# Patient Record
Sex: Male | Born: 1985 | Race: Black or African American | Hispanic: No | Marital: Married
Health system: Southern US, Community
[De-identification: ages and names within clinical notes are randomized; demographics above are authoritative.]

---

## 2016-05-19 ENCOUNTER — Ambulatory Visit
Admission: RE | Admit: 2016-05-19 | Discharge: 2016-05-19 | Disposition: A | Discharge status: Home / Self Care | Payer: No Typology Code available for payment source | Source: Ambulatory Visit | Attending: Infectious Disease | Admitting: Infectious Disease

## 2016-05-19 ENCOUNTER — Other Ambulatory Visit: Payer: Self-pay | Admitting: Infectious Disease

## 2016-05-19 DIAGNOSIS — R7611 Nonspecific reaction to tuberculin skin test without active tuberculosis: Secondary | ICD-10-CM

## 2018-08-11 ENCOUNTER — Encounter (HOSPITAL_COMMUNITY): Payer: Self-pay | Admitting: Emergency Medicine

## 2018-08-11 ENCOUNTER — Emergency Department (HOSPITAL_COMMUNITY)
Admission: EM | Admit: 2018-08-11 | Discharge: 2018-08-12 | Disposition: A | Discharge status: Home / Self Care | Payer: BLUE CROSS/BLUE SHIELD | Attending: Emergency Medicine | Admitting: Emergency Medicine

## 2018-08-11 ENCOUNTER — Emergency Department (HOSPITAL_COMMUNITY): Payer: BLUE CROSS/BLUE SHIELD

## 2018-08-11 DIAGNOSIS — R42 Dizziness and giddiness: Secondary | ICD-10-CM | POA: Insufficient documentation

## 2018-08-11 DIAGNOSIS — Z79899 Other long term (current) drug therapy: Secondary | ICD-10-CM | POA: Insufficient documentation

## 2018-08-11 MED ORDER — MECLIZINE HCL 25 MG PO TABS
25.0000 mg | ORAL_TABLET | Freq: Once | ORAL | Status: AC
  Administered 2018-08-11: 25 mg via ORAL
  Filled 2018-08-11: qty 1

## 2018-08-11 NOTE — ED Triage Notes (Signed)
Patient c/o dizziness x3 increasing with movement and decreasing when sitting. Denies N/V/D. Reports intermittent headache. Denies blurred vision and weakness. Ambulatory.

## 2018-08-12 ENCOUNTER — Emergency Department (HOSPITAL_COMMUNITY): Payer: BLUE CROSS/BLUE SHIELD

## 2018-08-12 MED ORDER — MECLIZINE HCL 25 MG PO TABS: 25.0000 mg | ORAL_TABLET | Freq: Three times a day (TID) | ORAL | 0 refills | Status: AC | PRN

## 2018-08-12 NOTE — ED Provider Notes (Signed)
[facility]-EMERGENCY DEPT Provider Note   CSN: [id number] Arrival date & time: 08/11/18  1826     History   Chief Complaint Chief Complaint  Patient presents with  . Dizziness    HPI Todd Potts is a 33 y.o. male.   Dizziness  Quality:  Head spinning Severity:  Moderate Onset quality:  Gradual Duration:  3 days Timing:  Constant Chronicity:  New Relieved by:  None tried Worsened by:  Nothing Ineffective treatments:  None tried   History reviewed. No pertinent past medical history.  There are no active problems to display for this patient.   History reviewed. No pertinent surgical history.      Home Medications    Prior to Admission medications   Medication Sig Start Date End Date Taking? Authorizing Provider  IRON PO Take 1 tablet by mouth once.   Yes [provider]  meclizine (ANTIVERT) 25 MG tablet Take 1 tablet (25 mg total) by mouth 3 (three) times daily as needed for dizziness. 08/12/18   Keerat Denicola, Barbara Cower, MD  Vitamin D, Ergocalciferol, (DRISDOL) 1.25 MG (50000 UT) CAPS capsule Take 50,000 Units by mouth once a week. 07/13/18   [provider]    Family History No family history on file.  Social History Social History   Tobacco Use  . Smoking status: Not on file  Substance Use Topics  . Alcohol use: Not on file  . Drug use: Not on file     Allergies   Patient has no known allergies.   Review of Systems Review of Systems  Neurological: Positive for dizziness.  All other systems reviewed and are negative.    Physical Exam Updated Vital Signs BP 126/76   Pulse 71   Temp 98.9 F (37.2 C) (Oral)   Resp 15   SpO2 100%   Physical Exam Vitals signs and nursing note reviewed.  Constitutional:      Appearance: He is well-developed.  HENT:     Head: Normocephalic and atraumatic.     Right Ear: Tympanic membrane normal.     Left Ear: Tympanic membrane normal.  Neck:     Musculoskeletal: Normal  range of motion.  Cardiovascular:     Rate and Rhythm: Normal rate.     Comments: Left lateral chest ttp. No obvious ecchymosis, trauma. Pulmonary:     Effort: Pulmonary effort is normal. No respiratory distress.  Abdominal:     General: There is no distension.  Musculoskeletal: Normal range of motion.  Skin:    General: Skin is warm and dry.  Neurological:     General: No focal deficit present.     Mental Status: He is alert and oriented to person, place, and time.     Cranial Nerves: No cranial nerve deficit.     Sensory: No sensory deficit.      ED Treatments / Results  Labs (all labs ordered are listed, but only abnormal results are displayed) Labs Reviewed - No data to display  EKG EKG Interpretation  Date/Time:  Wednesday August 11 2018 23:40:10 EST Ventricular Rate:  63 PR Interval:    QRS Duration: 88 QT Interval:  383 QTC Calculation: 392 R Axis:   53 Text Interpretation:  Unknown rhythm, irregular rate Probable left atrial enlargement ST elev, probable normal early repol pattern No old tracing to compare Confirmed by Marily Memos [phone]) on 08/12/2018 12:35:03 AM   Radiology Dg Chest 2 View  Result Date: 08/12/2018 CLINICAL DATA:  Left chest wall  pain x1 week EXAM: CHEST - 2 VIEW COMPARISON:  05/19/2016 FINDINGS: Lungs are clear.  No pleural effusion or pneumothorax. The heart is normal in size. Visualized osseous structures are within normal limits. IMPRESSION: Normal chest radiographs. Electronically Signed   By: Charline Bills M.D.   On: 08/12/2018 00:21    Procedures Procedures (including critical care time)  Medications Ordered in ED Medications  meclizine (ANTIVERT) tablet 25 mg (25 mg Oral Given 08/11/18 2331)     Initial Impression / Assessment and Plan / ED Course  I have reviewed the triage vital signs and the nursing notes.  Pertinent labs & imaging results that were available during my care of the patient were reviewed by me and  considered in my medical decision making (see chart for details).     Vertigo - unclear etiology. No red flags for central causes. No recent illnesses to suggest neuritis. Will treat for bppv with pcp follow up.   Chest pain - ecg and cxr ok. Reproducible. Likely MSK. Doubt PE/ACS or other acute causes. pcp follow up.   Final Clinical Impressions(s) / ED Diagnoses   Final diagnoses:  Vertigo    ED Discharge Orders         Ordered    meclizine (ANTIVERT) 25 MG tablet  3 times daily PRN     08/12/18 0127           Alahni Varone, Barbara Cower, MD 08/12/18 0131

## 2019-12-30 IMAGING — CR DG CHEST 2V
2 series · 2 of 2 positions shown · non-contrast
Comparison: 05/19/2016

CLINICAL DATA: Left chest wall pain x1 week

EXAM:
CHEST - 2 VIEW

[w chest pa]
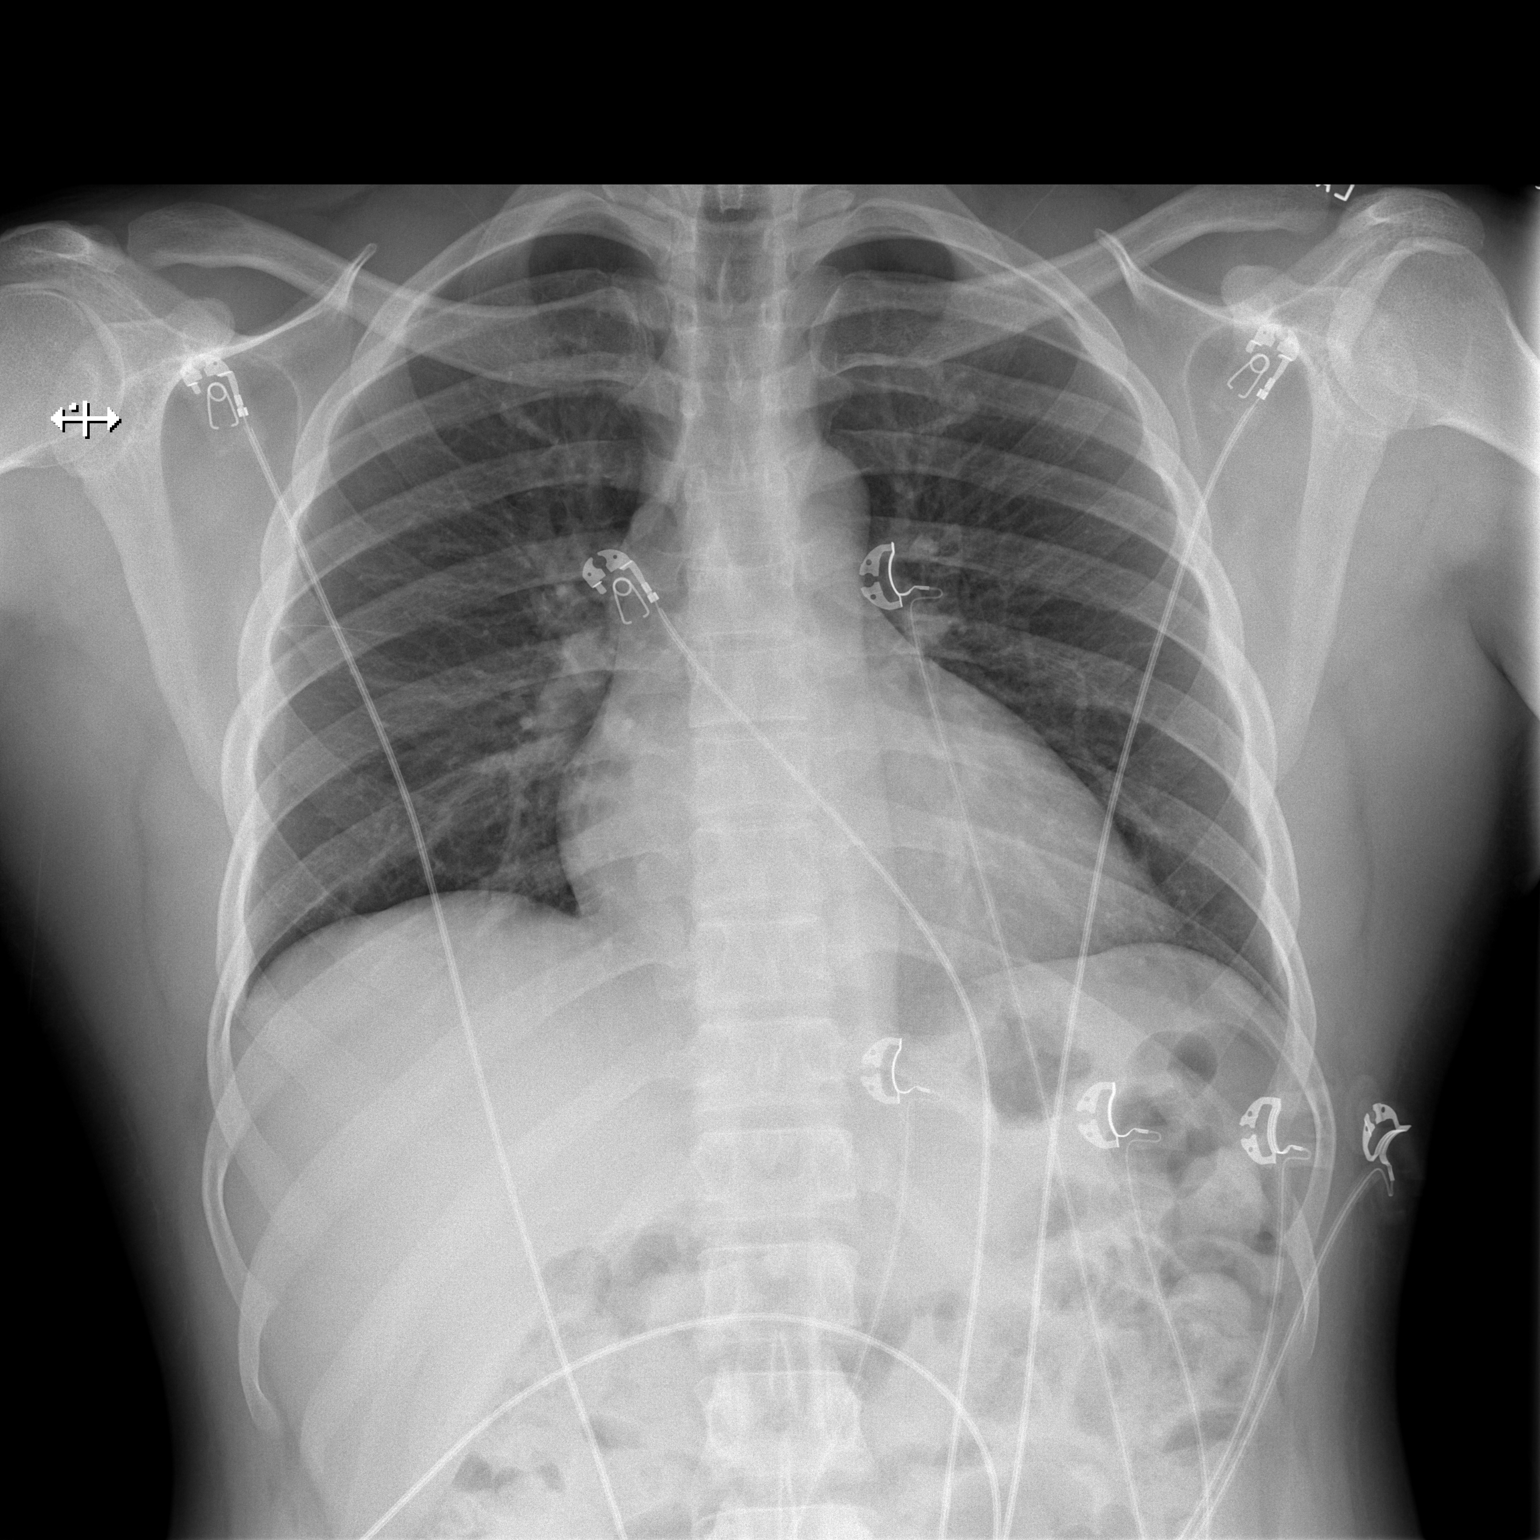

[w chest lat]
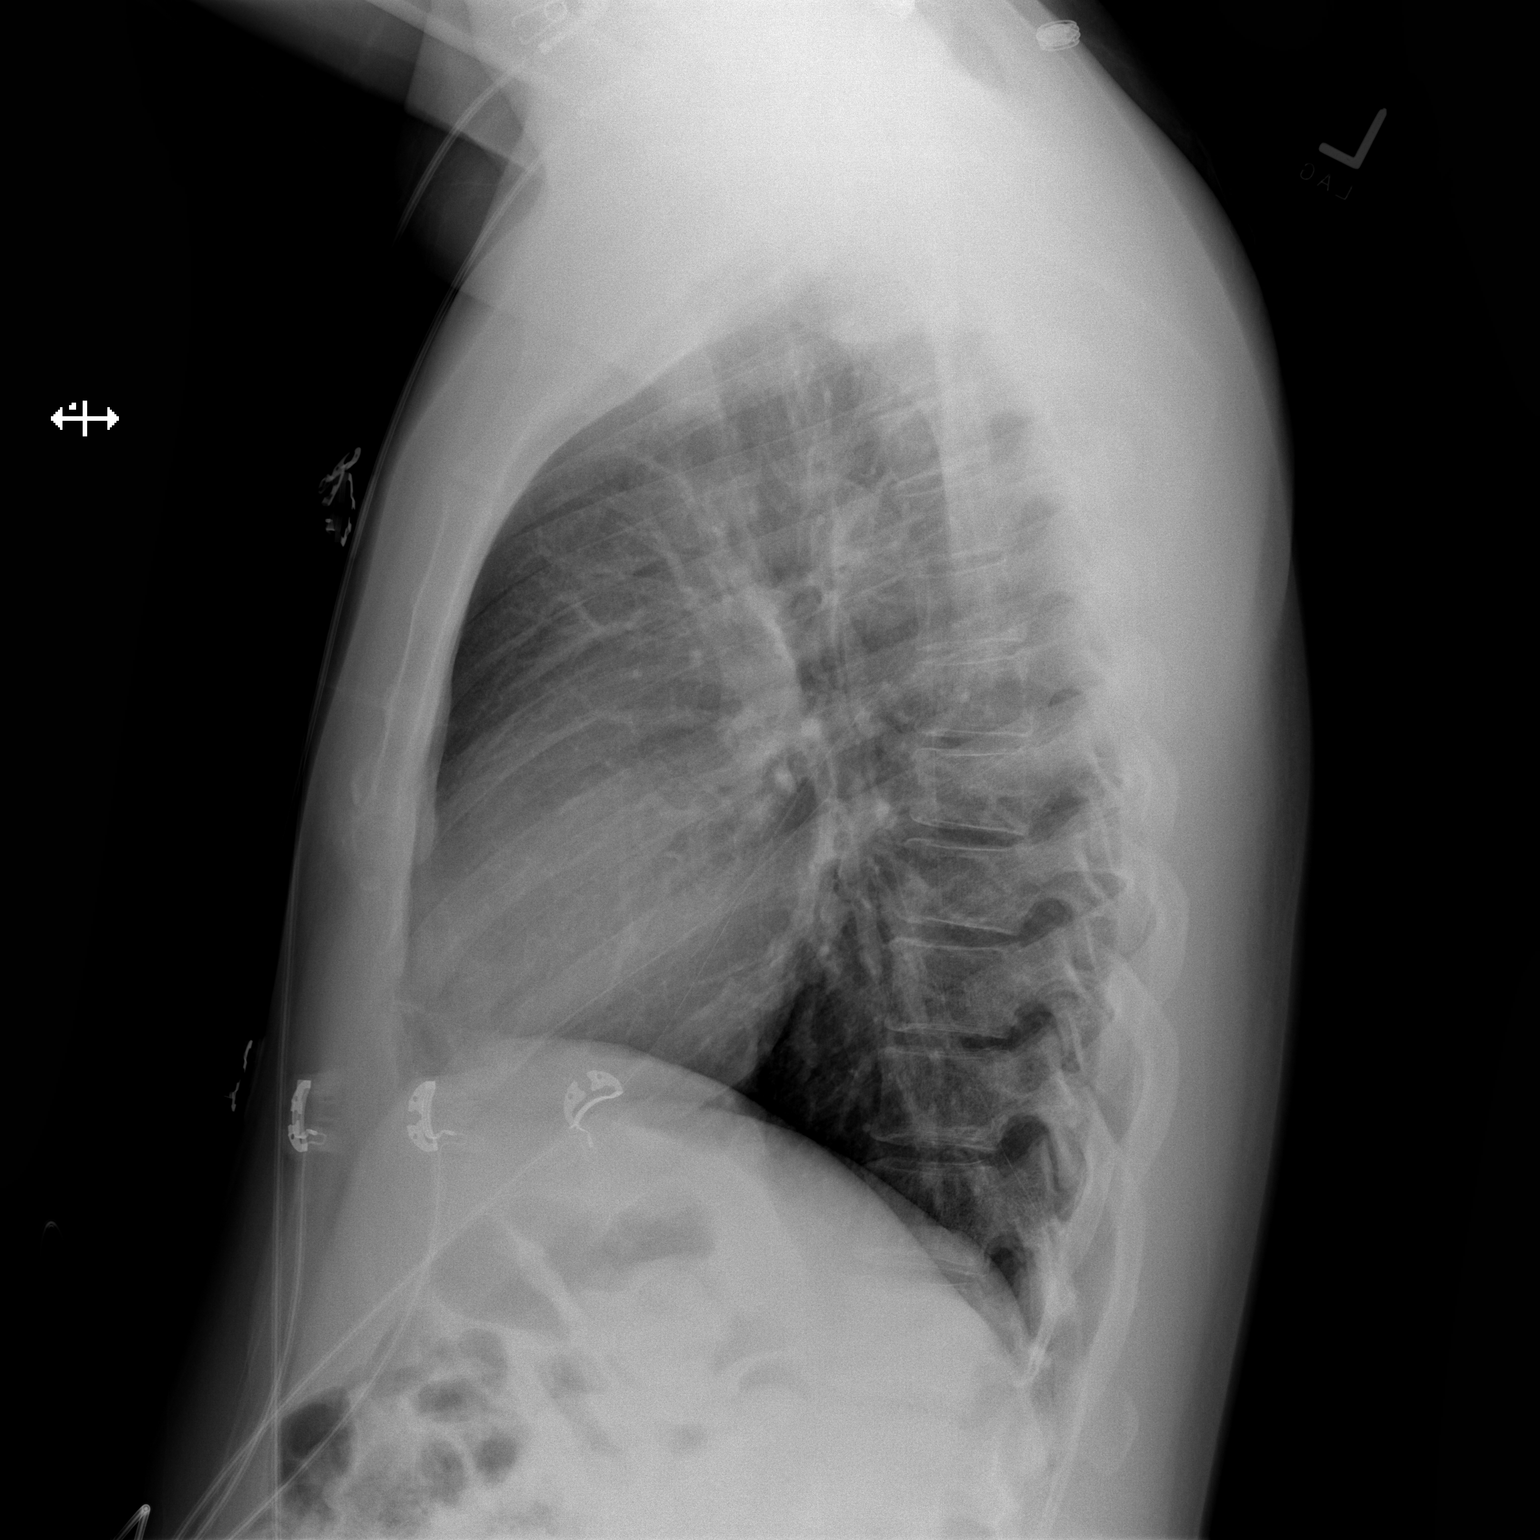

[2 of 2 positions shown; findings below may reference images not displayed]

FINDINGS: Lungs are clear.  No pleural effusion or pneumothorax.

The heart is normal in size.

Visualized osseous structures are within normal limits.
IMPRESSION: Normal chest radiographs.
